# Patient Record
Sex: Male | Born: 1985 | Race: White | Hispanic: No | Marital: Single | Smoking: Never smoker
Health system: Southern US, Community
[De-identification: ages and names within clinical notes are randomized; demographics above are authoritative.]

## PROBLEM LIST (undated history)

## (undated) HISTORY — PX: WRIST FRACTURE SURGERY: SHX121

## (undated) HISTORY — PX: FRACTURE SURGERY: SHX138

---
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
## 2008-03-03 ENCOUNTER — Emergency Department (HOSPITAL_COMMUNITY): Admission: EM | Admit: 2008-03-03 | Discharge: 2008-03-03 | Payer: Self-pay | Admitting: Emergency Medicine

## 2008-09-27 ENCOUNTER — Telehealth: Payer: Self-pay | Admitting: Orthopedic Surgery

## 2008-09-27 ENCOUNTER — Ambulatory Visit: Payer: Self-pay | Admitting: Orthopedic Surgery

## 2008-09-27 DIAGNOSIS — S62009A Unspecified fracture of navicular [scaphoid] bone of unspecified wrist, initial encounter for closed fracture: Secondary | ICD-10-CM | POA: Insufficient documentation

## 2008-09-28 ENCOUNTER — Telehealth: Payer: Self-pay | Admitting: Orthopedic Surgery

## 2008-09-29 ENCOUNTER — Ambulatory Visit (HOSPITAL_COMMUNITY): Admission: RE | Admit: 2008-09-29 | Discharge: 2008-09-29 | Payer: Self-pay | Admitting: Orthopedic Surgery

## 2008-10-01 ENCOUNTER — Encounter: Payer: Self-pay | Admitting: Orthopedic Surgery

## 2008-10-04 ENCOUNTER — Ambulatory Visit: Payer: Self-pay | Admitting: Orthopedic Surgery

## 2008-10-06 ENCOUNTER — Encounter: Payer: Self-pay | Admitting: Orthopedic Surgery

## 2008-10-08 ENCOUNTER — Encounter: Payer: Self-pay | Admitting: Orthopedic Surgery

## 2011-11-15 ENCOUNTER — Encounter (HOSPITAL_COMMUNITY): Payer: Self-pay | Admitting: *Deleted

## 2011-11-15 ENCOUNTER — Emergency Department (HOSPITAL_COMMUNITY)
Admission: EM | Admit: 2011-11-15 | Discharge: 2011-11-16 | Disposition: A | Discharge status: Home / Self Care | Payer: 59 | Attending: Emergency Medicine | Admitting: Emergency Medicine

## 2011-11-15 ENCOUNTER — Emergency Department (HOSPITAL_COMMUNITY): Payer: 59

## 2011-11-15 DIAGNOSIS — R609 Edema, unspecified: Secondary | ICD-10-CM | POA: Insufficient documentation

## 2011-11-15 DIAGNOSIS — M25579 Pain in unspecified ankle and joints of unspecified foot: Secondary | ICD-10-CM | POA: Insufficient documentation

## 2011-11-15 DIAGNOSIS — S93409A Sprain of unspecified ligament of unspecified ankle, initial encounter: Secondary | ICD-10-CM | POA: Insufficient documentation

## 2011-11-15 DIAGNOSIS — M79609 Pain in unspecified limb: Secondary | ICD-10-CM | POA: Insufficient documentation

## 2011-11-15 DIAGNOSIS — S93401A Sprain of unspecified ligament of right ankle, initial encounter: Secondary | ICD-10-CM

## 2011-11-15 DIAGNOSIS — M79671 Pain in right foot: Secondary | ICD-10-CM

## 2011-11-15 MED ORDER — HYDROCODONE-ACETAMINOPHEN 5-325 MG PO TABS
2.0000 | ORAL_TABLET | Freq: Once | ORAL | Status: AC
  Administered 2011-11-15: 2 via ORAL
  Filled 2011-11-15: qty 2

## 2011-11-15 MED ORDER — IBUPROFEN 800 MG PO TABS
800.0000 mg | ORAL_TABLET | Freq: Once | ORAL | Status: AC
  Administered 2011-11-15: 800 mg via ORAL
  Filled 2011-11-15: qty 1

## 2011-11-15 MED ORDER — HYDROCODONE-ACETAMINOPHEN 5-325 MG PO TABS: 2.0000 | ORAL_TABLET | Freq: Four times a day (QID) | ORAL | Status: AC | PRN

## 2011-11-15 NOTE — Discharge Instructions (Signed)
Elevate your foot. Use ice packs and to the swelling is gone. Leave the ankle support on now and for the next couple months to give your ankle extra-support especially when you are doing sports activities. Use the crutches until you're able to bear weight on your foot. Take the Norco for pain. Take ibuprofen 600 mg 4 times a day for pain also. Call Dr. Mort Sawyers office to get a recheck appointment in about one week.
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
## 2011-11-15 NOTE — ED Notes (Signed)
Discharge instructions reviewed with pt, questions answered. Pt verbalized understanding.  

## 2012-10-11 ENCOUNTER — Emergency Department (HOSPITAL_COMMUNITY): Payer: 59

## 2012-10-11 ENCOUNTER — Emergency Department (HOSPITAL_COMMUNITY)
Admission: EM | Admit: 2012-10-11 | Discharge: 2012-10-11 | Disposition: A | Discharge status: Home / Self Care | Payer: 59 | Attending: Emergency Medicine | Admitting: Emergency Medicine

## 2012-10-11 ENCOUNTER — Encounter (HOSPITAL_COMMUNITY): Payer: Self-pay | Admitting: *Deleted

## 2012-10-11 DIAGNOSIS — S161XXA Strain of muscle, fascia and tendon at neck level, initial encounter: Secondary | ICD-10-CM

## 2012-10-11 DIAGNOSIS — Y929 Unspecified place or not applicable: Secondary | ICD-10-CM | POA: Insufficient documentation

## 2012-10-11 DIAGNOSIS — Y9389 Activity, other specified: Secondary | ICD-10-CM | POA: Insufficient documentation

## 2012-10-11 DIAGNOSIS — S42023A Displaced fracture of shaft of unspecified clavicle, initial encounter for closed fracture: Secondary | ICD-10-CM | POA: Insufficient documentation

## 2012-10-11 DIAGNOSIS — S42022A Displaced fracture of shaft of left clavicle, initial encounter for closed fracture: Secondary | ICD-10-CM

## 2012-10-11 DIAGNOSIS — S139XXA Sprain of joints and ligaments of unspecified parts of neck, initial encounter: Secondary | ICD-10-CM | POA: Insufficient documentation

## 2012-10-11 MED ORDER — NAPROXEN 500 MG PO TABS: 500.0000 mg | ORAL_TABLET | Freq: Two times a day (BID) | ORAL | Status: DC

## 2012-10-11 MED ORDER — OXYCODONE-ACETAMINOPHEN 5-325 MG PO TABS: 1.0000 | ORAL_TABLET | ORAL | Status: DC | PRN

## 2012-10-11 MED ORDER — OXYCODONE-ACETAMINOPHEN 5-325 MG PO TABS
1.0000 | ORAL_TABLET | Freq: Once | ORAL | Status: AC
  Administered 2012-10-11: 1 via ORAL
  Filled 2012-10-11: qty 1
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
## 2012-10-13 ENCOUNTER — Encounter: Payer: Self-pay | Admitting: Orthopedic Surgery

## 2012-10-13 ENCOUNTER — Ambulatory Visit (INDEPENDENT_AMBULATORY_CARE_PROVIDER_SITE_OTHER): Payer: 59 | Admitting: Orthopedic Surgery

## 2012-10-13 VITALS — BP 110/60 | Ht 70.0 in | Wt 201.0 lb

## 2012-10-13 DIAGNOSIS — S42022A Displaced fracture of shaft of left clavicle, initial encounter for closed fracture: Secondary | ICD-10-CM

## 2012-10-13 DIAGNOSIS — S42023A Displaced fracture of shaft of unspecified clavicle, initial encounter for closed fracture: Secondary | ICD-10-CM | POA: Insufficient documentation

## 2012-10-13 MED ORDER — OXYCODONE-ACETAMINOPHEN 5-325 MG PO TABS: 1.0000 | ORAL_TABLET | ORAL | Status: DC | PRN
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
## 2012-10-13 NOTE — Patient Instructions (Signed)
OOW X 12 WEEKS   WEAR SLING AND SWATHE EXCEPT FOR BATHING X 6 WEEKS

## 2012-10-16 NOTE — ED Provider Notes (Signed)
Medical screening examination/treatment/procedure(s) were performed by non-physician practitioner and as supervising physician I was immediately available for consultation/collaboration.   Hoby Kawai III, MD 10/16/12 1103 

## 2012-11-25 ENCOUNTER — Ambulatory Visit (INDEPENDENT_AMBULATORY_CARE_PROVIDER_SITE_OTHER): Payer: 59

## 2012-11-25 ENCOUNTER — Encounter: Payer: Self-pay | Admitting: Orthopedic Surgery

## 2012-11-25 ENCOUNTER — Ambulatory Visit (INDEPENDENT_AMBULATORY_CARE_PROVIDER_SITE_OTHER): Payer: 59 | Admitting: Orthopedic Surgery

## 2012-11-25 VITALS — BP 104/70 | Ht 70.0 in | Wt 201.0 lb

## 2012-11-25 DIAGNOSIS — S42023A Displaced fracture of shaft of unspecified clavicle, initial encounter for closed fracture: Secondary | ICD-10-CM

## 2012-11-25 DIAGNOSIS — S42022A Displaced fracture of shaft of left clavicle, initial encounter for closed fracture: Secondary | ICD-10-CM

## 2012-11-25 MED ORDER — OXYCODONE-ACETAMINOPHEN 5-325 MG PO TABS: 1.0000 | ORAL_TABLET | ORAL | Status: DC | PRN
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
## 2012-11-25 NOTE — Patient Instructions (Addendum)
Clavicle Fracture A clavicle fracture is a break in the collarbone. This is a common injury, especially in children. Collarbones do not harden until around the age of 13. Most collarbone fractures are treated with a simple arm sling. In some cases a figure-of-eight splint is used to help hold the broken bones in position. Although not often needed, surgery may be required if the bone fragments are not in the correct position (displaced).  HOME CARE INSTRUCTIONS   Apply ice to the injury for 15 to 20 minutes each hour while awake for 2 days. Put the ice in a plastic bag and place a towel between the bag of ice and your skin.  Wear the sling or splint constantly for as long as directed by your caregiver. You may remove the sling or splint for bathing or showering. Be sure to keep your shoulder in the same place as when the sling or splint is on. Do not lift your arm.  If a figure-of-eight splint is applied, it must be tightened by another person every day. Tighten it enough to keep the shoulders held back. Allow enough room to place the index finger between the body and strap. Loosen the splint immediately if you feel numbness or tingling in your hands.  Only take over-the-counter or prescription medicines for pain, discomfort, or fever as directed by your caregiver.  Avoid activities that irritate or increase the pain for 4 to 6 weeks after surgery.  Follow all instructions for follow-up with your caregiver. This includes any referrals, physical therapy, and rehabilitation. Any delay in obtaining necessary care could result in a delay or failure of the injury to heal properly. SEEK MEDICAL CARE IF:  You have pain and swelling that are not relieved with medications. SEEK IMMEDIATE MEDICAL CARE IF:  Your arm is numb, cold, or pale, even when the splint is loose. MAKE SURE YOU:   Understand these instructions.  Will watch your condition.  Will get help right away if you are not doing well or get  worse. Document Released: 04/25/2005 Document Revised: 10/08/2011 Document Reviewed: 02/19/2008 [facility] Patient Information 2013 [place], [place]. Clavicle Fracture (Shaft) with Rehab A mid-shaft clavicle fracture is a break (fracture) in the collarbone (clavicle) that occurs in the middle third of the bone. Mid-shaft clavicle fractures are the most common type of clavicle fracture. SYMPTOMS  Pain, tenderness, and swelling over the fracture site.  Visible deformity or bump over the fracture site, if the fracture is complete and the bone fragments separate enough to distort the appearance of the top of the shoulder.  Bruising (contusion) at the site of injury (usually within 48 hours).  Loss of strength or pain, when attempting to use the affected arm.  Sometimes, numbness or coldness in the shoulder and arm on the affected side, if the blood supply is impaired.  Uncommonly, shortness of breath or difficulty breathing. CAUSES  Mid-shaft clavicle fractures are usually caused by direct hit (trauma) to the area of injury. The injury may also occur from indirect trauma, such as falling on an outstretched hand (uncommon). RISK INCREASES WITH:  Sports that require contact or collision (football, soccer, hockey, rugby).  Sports with high risk of falling on the shoulder (rodeo riding, mountain bike riding, cycling).  Previous shoulder sprain or dislocation.  Inadequate protective equipment.  History of bone or joint disease (osteoporosis). PREVENTION  Maintain appropriate conditioning, especially neck, shoulder, and arm muscle strength, endurance, and flexibility.  Ensure proper fit of protective equipment (shoulder pads).  Use proper technique and have a coach correct improper technique (including falling and landing). PROGNOSIS  If treated properly, mid-shaft clavicle fractures can be expected to heal. It is important to allow for the needed healing period, before returning to  activities. RELATED COMPLICATIONS   Pressure on or injury to nearby nerves, ligaments, tendons, muscles, blood vessels, or other tissues.  Weakness and fatigue of the arm or shoulder (uncommon).  Fracture fails to heal (nonunion).  Fracture heals in improper position (malunion).  Arthritis, pain, and inflammation of the acromioclavicular (AC) joint.  Longer healing time and vulnerable to recurring injury, if usual activities are resumed too soon.  Excessive scar tissue at the fracture site, including excessive bone formation, causing pressure on nerves and blood vessels in the neck or arm pit. This may lead to pain, numbness, and tingling in the neck, shoulder, arms, and hands.  Infection, if the bone breaks through the skin (open fracture), or at the incision if surgery is performed.  Persistent bump (prominence) at the fracture site.  Vulnerable to repeated collarbone injury. TREATMENT  Treatment first involves the use of ice, medicine and compression bandages, to reduce pain and inflammation. The shoulder should be restrained immediately. You should rest from activities for at least 6 weeks, to allow the bone to heal. Pain usually subsides within 2 to 4 weeks and goes away in 6 to 8 weeks. Surgery is not often advised for mid-shaft clavicle fractures. Surgery is able to reduce any bump (deformity) that was caused by injury, but surgery will also leave a scar in the area. Often, surgery is reserved for only very serious cases, where the bone protrudes through the skin (open fracture). Surgery involves repositioning the bones and fixing them in place with screws, pins, and plates. It may be necessary to remove the hardware after the fracture heals. After a period of rest and healing, it is important to perform stretching and strengthening exercises, in order to regain strength and a full range of motion. These exercises may be performed at home or with a therapist. You may return to sports  once you have regained your strength and range of motion, which usually takes 2 to 6 months.  MEDICATION   If pain medicine is needed, nonsteroidal anti-inflammatory medicines (aspirin and ibuprofen), or other minor pain relievers (acetaminophen), are often advised.  Do not take pain medicine for 7 days before surgery.  Prescription pain relievers may be given if your caregiver thinks they are needed. Use only as directed and only as much as you need. COLD THERAPY   Cold treatment (icing) should be applied for 10 to 15 minutes every 2 to 3 hours for inflammation and pain, and immediately after activity that aggravates your symptoms. Use ice packs or an ice massage. SEEK MEDICAL CARE IF:   Pain, swelling, or bruising gets worse, despite treatment.  You experience pain, numbness, or coldness in the arm or hand.  Blue, gray, or dark color appears in the hand or fingernails.  You develop a fever of greater than 100.5 F (38.1 C).  You develop shortness of breath.  New, unexplained symptoms develop. (Drugs used in treatment may produce side effects.) EXERCISES  RANGE OF MOTION (ROM) AND STRETCHING EXERCISES - Clavicle Fracture (Shaft) These exercises may help you restore your elbow mobility once your physician has discontinued your restraint period. Beginning these before your caregiver's approval may result in delayed healing. Your symptoms may go away with or without further involvement from your physician, physical  therapist or athletic trainer. While completing these exercises, remember:

## 2012-12-29 ENCOUNTER — Encounter: Payer: Self-pay | Admitting: Orthopedic Surgery

## 2012-12-29 ENCOUNTER — Ambulatory Visit (INDEPENDENT_AMBULATORY_CARE_PROVIDER_SITE_OTHER): Payer: 59

## 2012-12-29 ENCOUNTER — Ambulatory Visit (INDEPENDENT_AMBULATORY_CARE_PROVIDER_SITE_OTHER): Payer: 59 | Admitting: Orthopedic Surgery

## 2012-12-29 VITALS — BP 108/70 | Ht 70.0 in | Wt 201.0 lb

## 2012-12-29 DIAGNOSIS — IMO0001 Reserved for inherently not codable concepts without codable children: Secondary | ICD-10-CM

## 2012-12-29 DIAGNOSIS — S42002D Fracture of unspecified part of left clavicle, subsequent encounter for fracture with routine healing: Secondary | ICD-10-CM

## 2012-12-29 DIAGNOSIS — S42023A Displaced fracture of shaft of unspecified clavicle, initial encounter for closed fracture: Secondary | ICD-10-CM

## 2012-12-29 DIAGNOSIS — S42022A Displaced fracture of shaft of left clavicle, initial encounter for closed fracture: Secondary | ICD-10-CM

## 2012-12-29 MED ORDER — OXYCODONE-ACETAMINOPHEN 5-325 MG PO TABS: 1.0000 | ORAL_TABLET | Freq: Four times a day (QID) | ORAL | Status: DC | PRN

## 2012-12-29 NOTE — Patient Instructions (Addendum)
Return to work no restrictions °
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
## 2013-01-06 ENCOUNTER — Ambulatory Visit: Payer: 59 | Admitting: Orthopedic Surgery

## 2014-05-11 IMAGING — CR DG SHOULDER 2+V*L*
3 series · 3 of 3 positions shown · non-contrast
Comparison: None.

CLINICAL DATA: Pain post trauma

LEFT SHOULDER - 2+ VIEW

[view not recorded (1 of 3)]
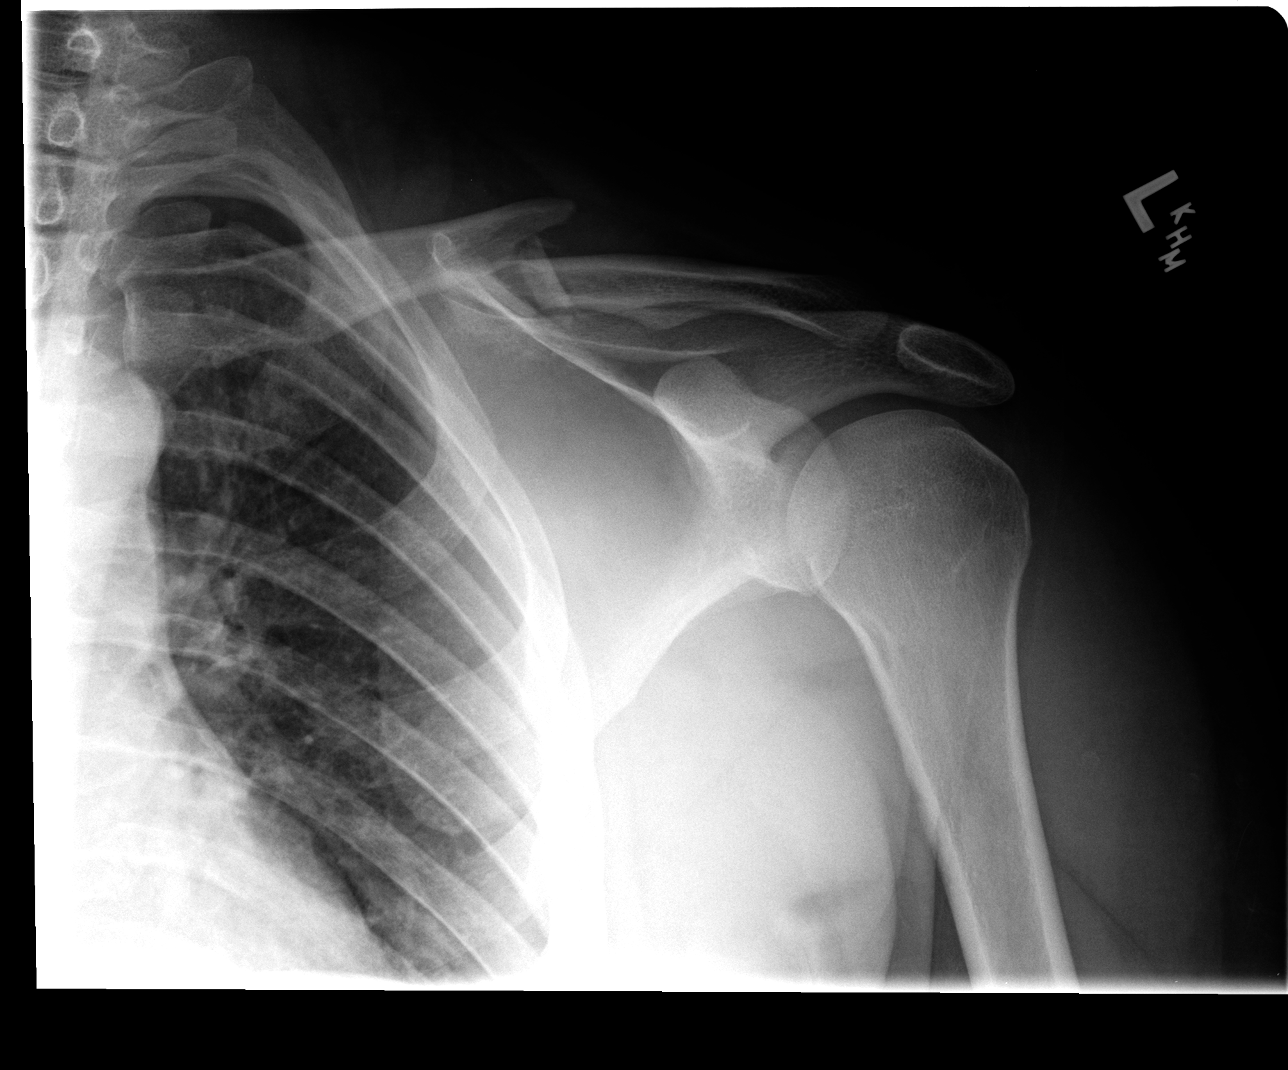

[view not recorded (2 of 3)]
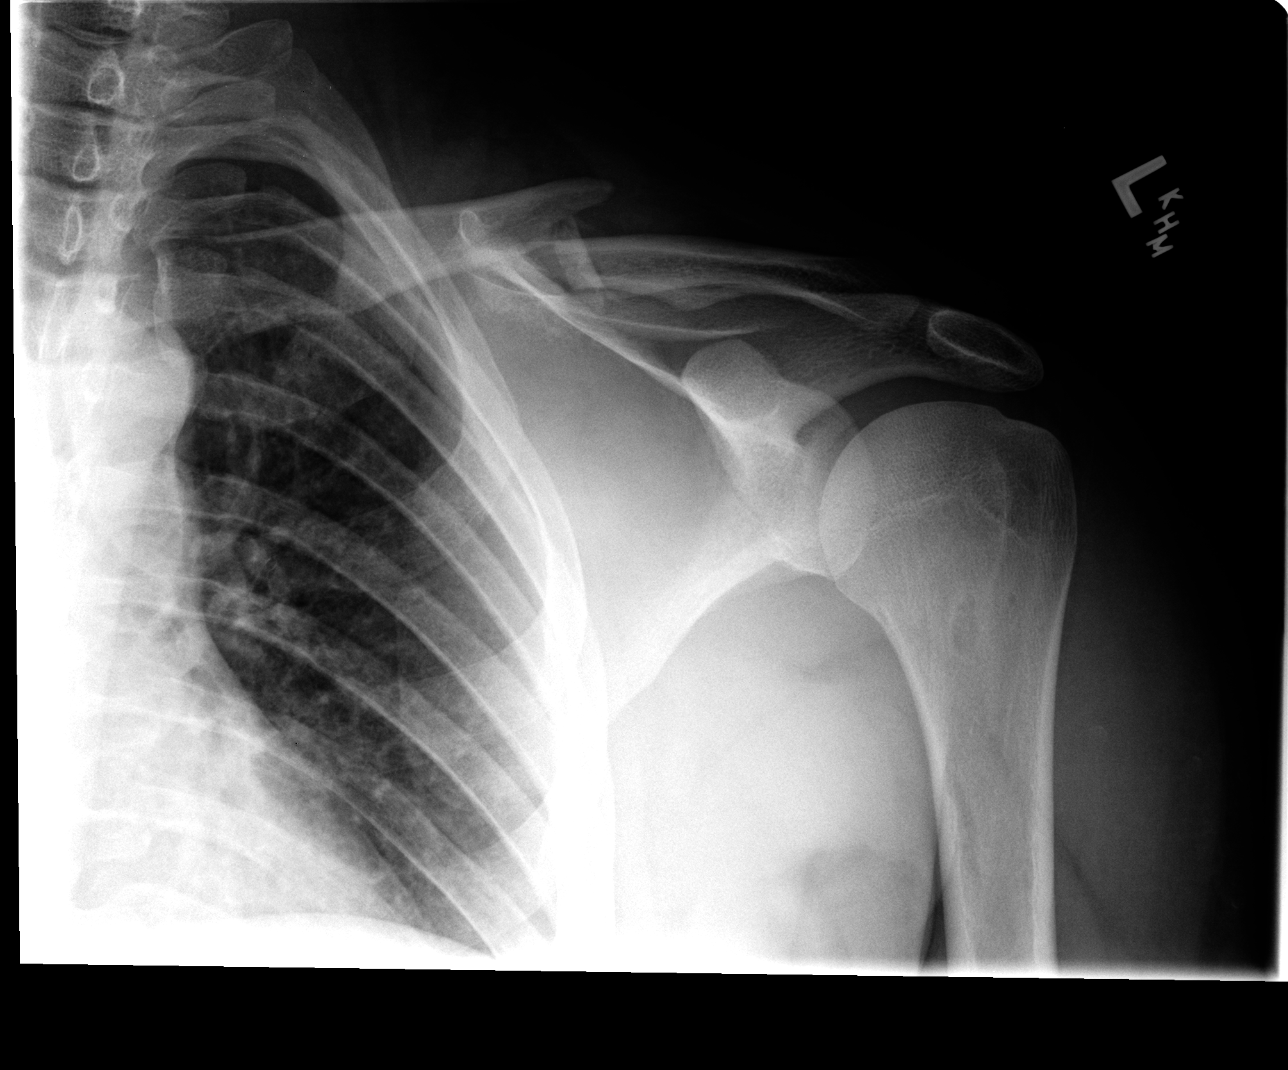

[view not recorded (3 of 3)]
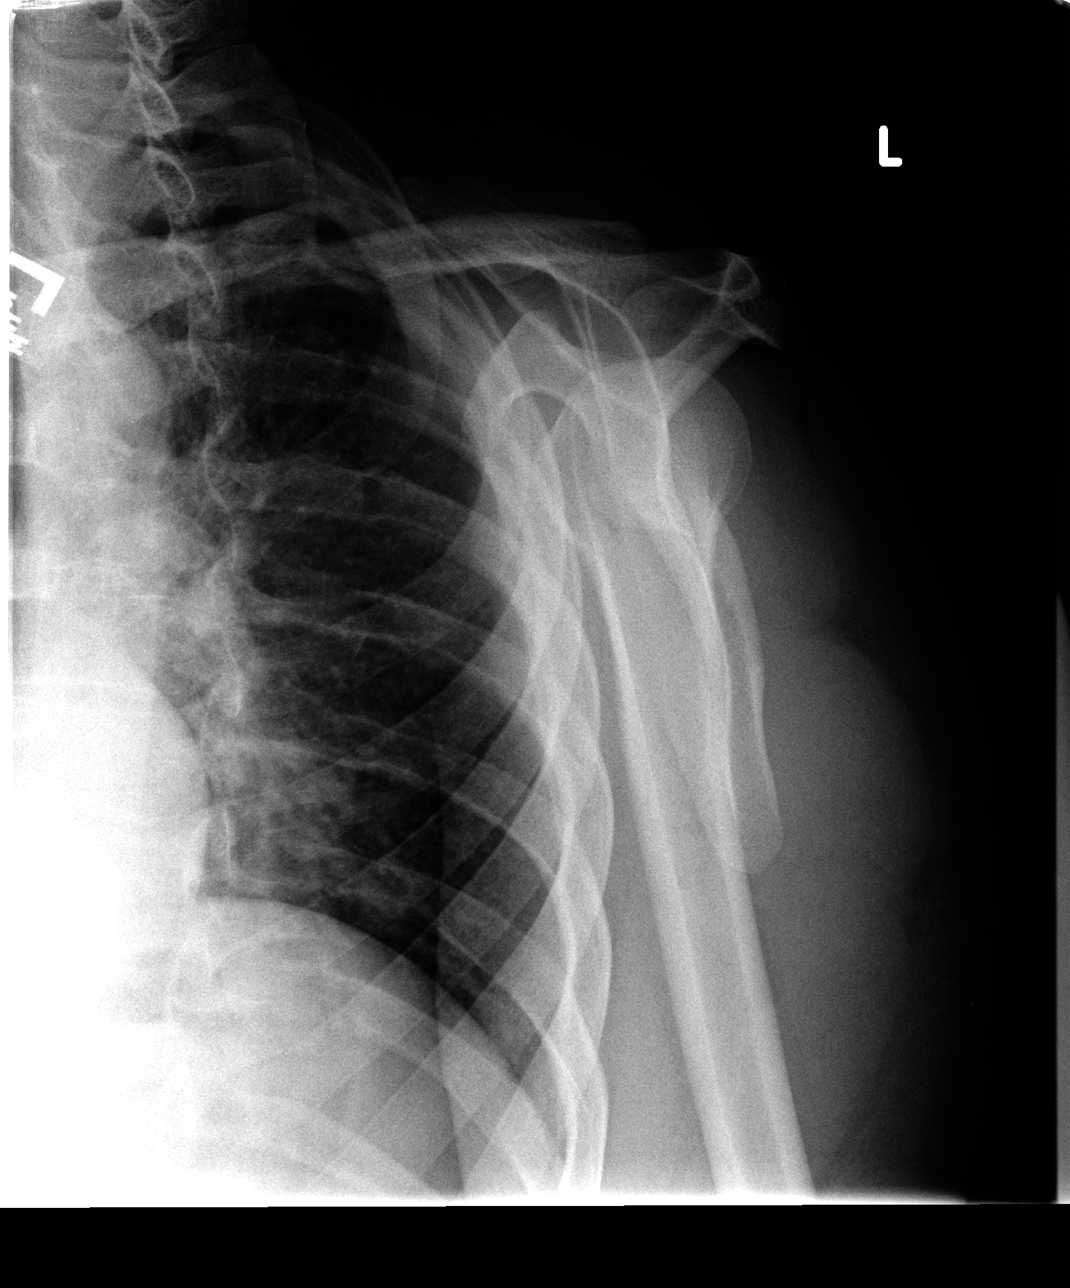

[3 of 3 positions shown; findings below may reference images not displayed]

FINDINGS: Internal rotation, external rotation, and scapular Y
views were obtained.  There is a comminuted fracture of the mid
left clavicle with inferior displacement of the distal major
fracture fragment with respect proximal fragment.  No other
fractures.  No dislocation.  Joint spaces appear intact.  There is
a fibrous cortical defect in the proximal humerus at the metaphysis
- diaphysis junction, benign finding.
IMPRESSION: Comminuted fracture mid left clavicle.

## 2016-06-06 ENCOUNTER — Ambulatory Visit (INDEPENDENT_AMBULATORY_CARE_PROVIDER_SITE_OTHER): Payer: BLUE CROSS/BLUE SHIELD | Admitting: Physician Assistant

## 2016-06-06 ENCOUNTER — Encounter: Payer: Self-pay | Admitting: Physician Assistant

## 2016-06-06 VITALS — BP 123/87 | HR 101 | Temp 99.2°F | Ht 70.0 in | Wt 215.6 lb

## 2016-06-06 DIAGNOSIS — R52 Pain, unspecified: Secondary | ICD-10-CM | POA: Diagnosis not present

## 2016-06-06 DIAGNOSIS — J209 Acute bronchitis, unspecified: Secondary | ICD-10-CM

## 2016-06-06 DIAGNOSIS — J029 Acute pharyngitis, unspecified: Secondary | ICD-10-CM | POA: Diagnosis not present

## 2016-06-06 LAB — RAPID STREP SCREEN (MED CTR MEBANE ONLY): STREP GP A AG, IA W/REFLEX: NEGATIVE

## 2016-06-06 LAB — VERITOR FLU A/B WAIVED
INFLUENZA A: NEGATIVE
INFLUENZA B: NEGATIVE

## 2016-06-06 LAB — CULTURE, GROUP A STREP

## 2016-06-06 MED ORDER — AZITHROMYCIN 250 MG PO TABS: ORAL_TABLET | ORAL | 0 refills | Status: DC

## 2016-06-06 MED ORDER — HYDROCODONE-HOMATROPINE 5-1.5 MG/5ML PO SYRP: 5.0000 mL | ORAL_SOLUTION | Freq: Three times a day (TID) | ORAL | 0 refills | Status: DC | PRN

## 2016-06-06 MED ORDER — METHYLPREDNISOLONE ACETATE 80 MG/ML IJ SUSP
80.0000 mg | Freq: Once | INTRAMUSCULAR | Status: AC
  Administered 2016-06-06: 80 mg via INTRAMUSCULAR

## 2016-06-06 NOTE — Patient Instructions (Signed)

## 2016-06-06 NOTE — Progress Notes (Signed)
BP 123/87   Pulse (!) 101   Temp 99.2 F (37.3 C) (Oral)   Ht 5\' 10"  (1.778 m)   Wt 215 lb 9.6 oz (97.8 kg)   BMI 30.94 kg/m    Subjective:    Patient ID: Cory Parrish, male    DOB: [birth year], 30 y.o.   MRN: [id number]  Cory Parrish is a 30 y.o. male presenting on 06/06/2016 for New Patient (Initial Visit); Generalized Body Aches; Cough; and Sore Throat   Cough  This is a new problem. The current episode started in the past 7 days. The problem has been gradually worsening. The problem occurs constantly. The cough is productive of sputum. Associated symptoms include chills, a fever, headaches, myalgias, postnasal drip, a sore throat, shortness of breath and wheezing. Pertinent negatives include no chest pain or rash.  Sore Throat   This is a new problem. The current episode started in the past 7 days. The problem has been gradually worsening. The pain is moderate. Associated symptoms include coughing, headaches and shortness of breath. Pertinent negatives include no abdominal pain, diarrhea or vomiting. He has tried acetaminophen and gargles for the symptoms.    History reviewed. No pertinent past medical history. Relevant past medical, surgical, family and social history reviewed and updated as indicated. Interim medical history since our last visit reviewed. Allergies and medications reviewed and updated.   Data reviewed from any sources in EPIC.  Review of Systems  Constitutional: Positive for chills, fatigue and fever. Negative for appetite change.  HENT: Positive for postnasal drip, sinus pressure and sore throat.   Eyes: Negative.  Negative for pain and visual disturbance.  Respiratory: Positive for cough, shortness of breath and wheezing. Negative for chest tightness.   Cardiovascular: Negative.  Negative for chest pain, palpitations and leg swelling.  Gastrointestinal: Negative.  Negative for abdominal pain, diarrhea, nausea and vomiting.  Endocrine: Negative.     Genitourinary: Negative.   Musculoskeletal: Positive for back pain and myalgias.  Skin: Negative.  Negative for color change and rash.  Neurological: Positive for headaches. Negative for weakness and numbness.  Psychiatric/Behavioral: Negative.      Social History   Social History  . Marital status: Single    Spouse name: N/A  . Number of children: N/A  . Years of education: N/A   Occupational History  . Not on file.   Social History Main Topics  . Smoking status: Never Smoker  . Smokeless tobacco: Never Used  . Alcohol use Yes     Comment: occasional  . Drug use: No  . Sexual activity: Not on file   Other Topics Concern  . Not on file   Social History Narrative  . No narrative on file    Past Surgical History:  Procedure Laterality Date  . FRACTURE SURGERY    . WRIST FRACTURE SURGERY      Family History  Problem Relation Age of Onset  . Cancer        Medication List       Accurate as of 06/06/16  1:22 PM. Always use your most recent med list.          azithromycin 250 MG tablet Commonly known as:  ZITHROMAX Z-PAK As directed   HYDROcodone-homatropine 5-1.5 MG/5ML syrup Commonly known as:  HYCODAN Take 5-10 mLs by mouth every 8 (eight) hours as needed for cough.          Objective:    BP 123/87   Pulse Marland Kitchen(!)  101   Temp 99.2 F (37.3 C) (Oral)   Ht 5\' 10"  (1.778 m)   Wt 215 lb 9.6 oz (97.8 kg)   BMI 30.94 kg/m   No Known Allergies Wt Readings from Last 3 Encounters:  06/06/16 215 lb 9.6 oz (97.8 kg)  12/29/12 201 lb (91.2 kg)  11/25/12 201 lb (91.2 kg)    Physical Exam  Constitutional: He appears well-developed and well-nourished.  HENT:  Head: Normocephalic and atraumatic.  Right Ear: Hearing and tympanic membrane normal.  Left Ear: Hearing and tympanic membrane normal.  Nose: Mucosal edema and sinus tenderness present. No nasal deformity. Right sinus exhibits frontal sinus tenderness. Left sinus exhibits frontal sinus tenderness.   Mouth/Throat: Posterior oropharyngeal erythema present.  Eyes: Conjunctivae and EOM are normal. Pupils are equal, round, and reactive to light. Right eye exhibits no discharge. Left eye exhibits no discharge.  Neck: Normal range of motion. Neck supple.  Cardiovascular: Normal rate, regular rhythm and normal heart sounds.   Pulmonary/Chest: Effort normal. No respiratory distress. He has no decreased breath sounds. He has wheezes. He has no rhonchi. He has no rales.  Abdominal: Soft. Bowel sounds are normal.  Musculoskeletal: Normal range of motion.  Skin: Skin is warm and dry.      Assessment & Plan:   1. Sore throat - [facility] - HYDROcodone-homatropine (HYCODAN) 5-1.5 MG/5ML syrup; Take 5-10 mLs by mouth every 8 (eight) hours as needed for cough.  Dispense: 240 mL; Refill: 0  2. Body aches - Veritor Flu A/B Waived  3. Acute bronchitis, unspecified organism - HYDROcodone-homatropine (HYCODAN) 5-1.5 MG/5ML syrup; Take 5-10 mLs by mouth every 8 (eight) hours as needed for cough.  Dispense: 240 mL; Refill: 0 - methylPREDNISolone acetate (DEPO-MEDROL) injection 80 mg; Inject 1 mL (80 mg total) into the muscle once. - azithromycin (ZITHROMAX Z-PAK) 250 MG tablet; As directed  Dispense: 6 tablet; Refill: 0   Continue all other maintenance medications as listed above. Educational handout given for bronchitis  Follow up plan: Return if symptoms worsen or fail to improve.  Remus LofflerAngel S. Jayana Kotula PA-C [facility] [address]  [place], [place] [address] [phone]   06/06/2016, 1:22 PM

## 2016-09-10 ENCOUNTER — Encounter: Payer: Self-pay | Admitting: Family Medicine

## 2016-09-10 ENCOUNTER — Ambulatory Visit (INDEPENDENT_AMBULATORY_CARE_PROVIDER_SITE_OTHER): Payer: 59 | Admitting: Family Medicine

## 2016-09-10 VITALS — BP 125/80 | HR 102 | Temp 97.9°F | Ht 70.0 in | Wt 207.0 lb

## 2016-09-10 DIAGNOSIS — J101 Influenza due to other identified influenza virus with other respiratory manifestations: Secondary | ICD-10-CM | POA: Diagnosis not present

## 2016-09-10 LAB — VERITOR FLU A/B WAIVED
Influenza A: POSITIVE — AB
Influenza B: NEGATIVE

## 2016-09-10 MED ORDER — OSELTAMIVIR PHOSPHATE 75 MG PO CAPS: 75.0000 mg | ORAL_CAPSULE | Freq: Two times a day (BID) | ORAL | 0 refills | Status: AC

## 2016-09-10 NOTE — Progress Notes (Signed)
BP 125/80   Pulse (!) 102   Temp 97.9 F (36.6 C) (Oral)   Ht 5\' 10"  (1.778 m)   Wt 207 lb (93.9 kg)   BMI 29.70 kg/m    Subjective:    Patient ID: Meldrick Buttery, male    DOB: [birth year], 31 y.o.   MRN: [id number]  HPI: Cory Parrish is a 31 y.o. male presenting on 09/10/2016 for Cough; Fever; and Generalized Body Aches   HPI Cough fever and body aches Patient was having cough and fever and body aches that began 2 days ago. He did have a cough that started last week and he got over that and then 2 days ago he started with a cough and fever and body aches and chills. He says his fever was as high as 102. He has been taking ibuprofen and Tylenol for this. He still been having a very significant cough that is productive. He denies any sick contacts that he knows of. He has not really taken anything over-the-counter except Tylenol ibuprofen yet. He denies any shortness of breath or wheezing.  Relevant past medical, surgical, family and social history reviewed and updated as indicated. Interim medical history since our last visit reviewed. Allergies and medications reviewed and updated.  Review of Systems  Constitutional: Positive for chills and fever.  HENT: Positive for congestion, postnasal drip, rhinorrhea, sinus pressure, sneezing and sore throat. Negative for ear discharge, ear pain and voice change.   Eyes: Negative for pain, discharge, redness and visual disturbance.  Respiratory: Positive for cough. Negative for chest tightness, shortness of breath and wheezing.   Cardiovascular: Negative for chest pain and leg swelling.  Gastrointestinal: Negative for abdominal pain, constipation and diarrhea.  Genitourinary: Negative for difficulty urinating.  Musculoskeletal: Positive for myalgias. Negative for back pain and gait problem.  Skin: Negative for rash.  Neurological: Negative for syncope, light-headedness and headaches.  All other systems reviewed and are negative.   Per  HPI unless specifically indicated above   Allergies as of 09/10/2016   No Known Allergies     Medication List       Accurate as of 09/10/16 12:29 PM. Always use your most recent med list.          oseltamivir 75 MG capsule Commonly known as:  TAMIFLU Take 1 capsule (75 mg total) by mouth 2 (two) times daily.          Objective:    BP 125/80   Pulse (!) 102   Temp 97.9 F (36.6 C) (Oral)   Ht 5\' 10"  (1.778 m)   Wt 207 lb (93.9 kg)   BMI 29.70 kg/m   Wt Readings from Last 3 Encounters:  09/10/16 207 lb (93.9 kg)  06/06/16 215 lb 9.6 oz (97.8 kg)  12/29/12 201 lb (91.2 kg)    Physical Exam  Constitutional: He is oriented to person, place, and time. He appears well-developed and well-nourished. No distress.  HENT:  Right Ear: Tympanic membrane, external ear and ear canal normal.  Left Ear: Tympanic membrane, external ear and ear canal normal.  Nose: Mucosal edema and rhinorrhea present. No sinus tenderness. No epistaxis. Right sinus exhibits maxillary sinus tenderness. Right sinus exhibits no frontal sinus tenderness. Left sinus exhibits maxillary sinus tenderness. Left sinus exhibits no frontal sinus tenderness.  Mouth/Throat: Uvula is midline and mucous membranes are normal. Posterior oropharyngeal edema and posterior oropharyngeal erythema present. No oropharyngeal exudate or tonsillar abscesses.  Eyes: Conjunctivae are normal. Right eye exhibits no discharge.  Left eye exhibits no discharge. No scleral icterus.  Neck: Neck supple. No thyromegaly present.  Cardiovascular: Normal rate, regular rhythm, normal heart sounds and intact distal pulses.   No murmur heard. Pulmonary/Chest: Effort normal and breath sounds normal. No respiratory distress. He has no wheezes. He has no rales.  Musculoskeletal: Normal range of motion. He exhibits no edema.  Lymphadenopathy:    He has no cervical adenopathy.  Neurological: He is alert and oriented to person, place, and time.  Coordination normal.  Skin: Skin is warm and dry. No rash noted. He is not diaphoretic.  Psychiatric: He has a normal mood and affect. His behavior is normal.  Nursing note and vitals reviewed.   Influenza A positive    Assessment & Plan:   Problem List Items Addressed This Visit    None    Visit Diagnoses    Influenza A    -  Primary   Relevant Medications   oseltamivir (TAMIFLU) 75 MG capsule   Other Relevant Orders   Veritor Flu A/B Waived       Follow up plan: Return if symptoms worsen or fail to improve.  Counseling provided for all of the vaccine components Orders Placed This Encounter  Procedures  . Veritor Flu A/B Reynolds BowlWaived    Davyon Fisch, MD [facility] 09/10/2016, 12:29 PM

## 2019-02-05 ENCOUNTER — Other Ambulatory Visit: Payer: 59

## 2019-02-05 ENCOUNTER — Other Ambulatory Visit: Payer: Self-pay

## 2019-02-05 DIAGNOSIS — Z20822 Contact with and (suspected) exposure to covid-19: Secondary | ICD-10-CM

## 2019-02-05 DIAGNOSIS — U071 COVID-19: Secondary | ICD-10-CM

## 2019-02-05 DIAGNOSIS — J069 Acute upper respiratory infection, unspecified: Secondary | ICD-10-CM

## 2019-02-09 LAB — NOVEL CORONAVIRUS, NAA: SARS-CoV-2, NAA: NOT DETECTED

## 2019-09-09 ENCOUNTER — Ambulatory Visit: Payer: 59 | Attending: Internal Medicine

## 2019-09-09 ENCOUNTER — Other Ambulatory Visit: Payer: Self-pay

## 2019-09-09 DIAGNOSIS — Z20822 Contact with and (suspected) exposure to covid-19: Secondary | ICD-10-CM

## 2019-09-10 LAB — NOVEL CORONAVIRUS, NAA: SARS-CoV-2, NAA: DETECTED — AB

## 2023-01-18 DIAGNOSIS — S065X0A Traumatic subdural hemorrhage without loss of consciousness, initial encounter: Secondary | ICD-10-CM | POA: Diagnosis not present

## 2023-01-18 DIAGNOSIS — Z978 Presence of other specified devices: Secondary | ICD-10-CM | POA: Diagnosis not present

## 2023-01-18 DIAGNOSIS — S20411A Abrasion of right back wall of thorax, initial encounter: Secondary | ICD-10-CM | POA: Diagnosis not present

## 2023-01-18 DIAGNOSIS — S020XXA Fracture of vault of skull, initial encounter for closed fracture: Secondary | ICD-10-CM | POA: Diagnosis not present

## 2023-01-18 DIAGNOSIS — S22048A Other fracture of fourth thoracic vertebra, initial encounter for closed fracture: Secondary | ICD-10-CM | POA: Diagnosis not present

## 2023-01-18 DIAGNOSIS — S06A1XA Traumatic brain compression with herniation, initial encounter: Secondary | ICD-10-CM | POA: Diagnosis not present

## 2023-01-18 DIAGNOSIS — S22049A Unspecified fracture of fourth thoracic vertebra, initial encounter for closed fracture: Secondary | ICD-10-CM | POA: Diagnosis not present

## 2023-01-18 DIAGNOSIS — S22038A Other fracture of third thoracic vertebra, initial encounter for closed fracture: Secondary | ICD-10-CM | POA: Diagnosis not present

## 2023-01-18 DIAGNOSIS — J984 Other disorders of lung: Secondary | ICD-10-CM | POA: Diagnosis not present

## 2023-01-18 DIAGNOSIS — G259 Extrapyramidal and movement disorder, unspecified: Secondary | ICD-10-CM | POA: Diagnosis not present

## 2023-01-18 DIAGNOSIS — S22079A Unspecified fracture of T9-T10 vertebra, initial encounter for closed fracture: Secondary | ICD-10-CM | POA: Diagnosis not present

## 2023-01-18 DIAGNOSIS — Z515 Encounter for palliative care: Secondary | ICD-10-CM | POA: Diagnosis not present

## 2023-01-18 DIAGNOSIS — Y92410 Unspecified street and highway as the place of occurrence of the external cause: Secondary | ICD-10-CM | POA: Diagnosis not present

## 2023-01-18 DIAGNOSIS — S069X9A Unspecified intracranial injury with loss of consciousness of unspecified duration, initial encounter: Secondary | ICD-10-CM | POA: Diagnosis not present

## 2023-01-18 DIAGNOSIS — Z043 Encounter for examination and observation following other accident: Secondary | ICD-10-CM | POA: Diagnosis not present

## 2023-01-18 DIAGNOSIS — R402431 Glasgow coma scale score 3-8, in the field [EMT or ambulance]: Secondary | ICD-10-CM | POA: Diagnosis not present

## 2023-01-18 DIAGNOSIS — Y93I9 Activity, other involving external motion: Secondary | ICD-10-CM | POA: Diagnosis not present

## 2023-01-18 DIAGNOSIS — S22059A Unspecified fracture of T5-T6 vertebra, initial encounter for closed fracture: Secondary | ICD-10-CM | POA: Diagnosis not present

## 2023-01-18 DIAGNOSIS — Z66 Do not resuscitate: Secondary | ICD-10-CM | POA: Diagnosis not present

## 2023-01-18 DIAGNOSIS — S22058A Other fracture of T5-T6 vertebra, initial encounter for closed fracture: Secondary | ICD-10-CM | POA: Diagnosis not present

## 2023-01-18 DIAGNOSIS — S42001A Fracture of unspecified part of right clavicle, initial encounter for closed fracture: Secondary | ICD-10-CM | POA: Diagnosis not present

## 2023-01-18 DIAGNOSIS — S22068A Other fracture of T7-T8 thoracic vertebra, initial encounter for closed fracture: Secondary | ICD-10-CM | POA: Diagnosis not present

## 2023-01-18 DIAGNOSIS — S066XAA Traumatic subarachnoid hemorrhage with loss of consciousness status unknown, initial encounter: Secondary | ICD-10-CM | POA: Diagnosis not present

## 2023-01-18 DIAGNOSIS — S22039A Unspecified fracture of third thoracic vertebra, initial encounter for closed fracture: Secondary | ICD-10-CM | POA: Diagnosis not present

## 2023-01-18 DIAGNOSIS — S199XXA Unspecified injury of neck, initial encounter: Secondary | ICD-10-CM | POA: Diagnosis not present

## 2023-01-18 DIAGNOSIS — R918 Other nonspecific abnormal finding of lung field: Secondary | ICD-10-CM | POA: Diagnosis not present

## 2023-01-18 DIAGNOSIS — S30811A Abrasion of abdominal wall, initial encounter: Secondary | ICD-10-CM | POA: Diagnosis not present

## 2023-01-18 DIAGNOSIS — S22078A Other fracture of T9-T10 vertebra, initial encounter for closed fracture: Secondary | ICD-10-CM | POA: Diagnosis not present

## 2023-01-18 DIAGNOSIS — R0902 Hypoxemia: Secondary | ICD-10-CM | POA: Diagnosis not present

## 2023-01-18 DIAGNOSIS — S06360A Traumatic hemorrhage of cerebrum, unspecified, without loss of consciousness, initial encounter: Secondary | ICD-10-CM | POA: Diagnosis not present

## 2023-01-18 DIAGNOSIS — S065XAA Traumatic subdural hemorrhage with loss of consciousness status unknown, initial encounter: Secondary | ICD-10-CM | POA: Diagnosis not present

## 2023-01-18 DIAGNOSIS — S22069A Unspecified fracture of T7-T8 vertebra, initial encounter for closed fracture: Secondary | ICD-10-CM | POA: Diagnosis not present

## 2023-01-19 DIAGNOSIS — R001 Bradycardia, unspecified: Secondary | ICD-10-CM | POA: Diagnosis not present

## 2023-01-19 DIAGNOSIS — S065X0A Traumatic subdural hemorrhage without loss of consciousness, initial encounter: Secondary | ICD-10-CM | POA: Diagnosis not present

## 2023-01-28 DEATH — deceased
